# Patient Record
Sex: Female | Born: 1980 | Race: White | Hispanic: No | Marital: Single | Smoking: Current every day smoker
Health system: Southern US, Community
[De-identification: ages and names within clinical notes are randomized; demographics above are authoritative.]

## PROBLEM LIST (undated history)

## (undated) DIAGNOSIS — J40 Bronchitis, not specified as acute or chronic: Secondary | ICD-10-CM

## (undated) DIAGNOSIS — J45909 Unspecified asthma, uncomplicated: Secondary | ICD-10-CM

## (undated) DIAGNOSIS — Z836 Family history of other diseases of the respiratory system: Secondary | ICD-10-CM

## (undated) HISTORY — PX: TUBAL LIGATION: SHX77

## (undated) HISTORY — PX: HAND SURGERY: SHX662

---
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
## 2002-03-19 ENCOUNTER — Emergency Department (HOSPITAL_COMMUNITY): Admission: EM | Admit: 2002-03-19 | Discharge: 2002-03-19 | Payer: Self-pay | Admitting: Emergency Medicine

## 2002-04-23 ENCOUNTER — Ambulatory Visit (HOSPITAL_COMMUNITY): Admission: RE | Admit: 2002-04-23 | Discharge: 2002-04-23 | Payer: Self-pay | Admitting: *Deleted

## 2003-06-26 ENCOUNTER — Ambulatory Visit (HOSPITAL_COMMUNITY): Admission: RE | Admit: 2003-06-26 | Discharge: 2003-06-26 | Payer: Self-pay | Admitting: *Deleted

## 2003-06-26 ENCOUNTER — Encounter: Payer: Self-pay | Admitting: *Deleted

## 2003-08-01 ENCOUNTER — Encounter: Payer: Self-pay | Admitting: Emergency Medicine

## 2003-08-02 ENCOUNTER — Ambulatory Visit (HOSPITAL_COMMUNITY): Admission: EM | Admit: 2003-08-02 | Discharge: 2003-08-02 | Payer: Self-pay | Admitting: Emergency Medicine

## 2003-08-13 ENCOUNTER — Ambulatory Visit (HOSPITAL_COMMUNITY): Admission: RE | Admit: 2003-08-13 | Discharge: 2003-08-13 | Payer: Self-pay | Admitting: *Deleted

## 2003-10-30 ENCOUNTER — Inpatient Hospital Stay (HOSPITAL_COMMUNITY): Admission: AD | Admit: 2003-10-30 | Discharge: 2003-10-31 | Payer: Self-pay | Admitting: *Deleted

## 2005-03-06 ENCOUNTER — Emergency Department (HOSPITAL_COMMUNITY): Admission: EM | Admit: 2005-03-06 | Discharge: 2005-03-06 | Payer: Self-pay | Admitting: Emergency Medicine

## 2005-08-23 ENCOUNTER — Ambulatory Visit (HOSPITAL_COMMUNITY): Admission: RE | Admit: 2005-08-23 | Discharge: 2005-08-23 | Payer: Self-pay | Admitting: *Deleted

## 2005-12-27 ENCOUNTER — Ambulatory Visit (HOSPITAL_COMMUNITY): Admission: AD | Admit: 2005-12-27 | Discharge: 2005-12-27 | Payer: Self-pay | Admitting: *Deleted

## 2006-01-05 ENCOUNTER — Inpatient Hospital Stay (HOSPITAL_COMMUNITY): Admission: AD | Admit: 2006-01-05 | Discharge: 2006-01-06 | Payer: Self-pay | Admitting: *Deleted

## 2006-01-29 ENCOUNTER — Emergency Department (HOSPITAL_COMMUNITY): Admission: EM | Admit: 2006-01-29 | Discharge: 2006-01-29 | Payer: Self-pay | Admitting: Emergency Medicine

## 2006-03-20 ENCOUNTER — Emergency Department (HOSPITAL_COMMUNITY): Admission: EM | Admit: 2006-03-20 | Discharge: 2006-03-20 | Payer: Self-pay | Admitting: Emergency Medicine

## 2006-07-17 ENCOUNTER — Ambulatory Visit (HOSPITAL_COMMUNITY): Admission: RE | Admit: 2006-07-17 | Discharge: 2006-07-17 | Payer: Self-pay | Admitting: Obstetrics and Gynecology

## 2007-08-26 ENCOUNTER — Emergency Department (HOSPITAL_COMMUNITY): Admission: EM | Admit: 2007-08-26 | Discharge: 2007-08-26 | Payer: Self-pay | Admitting: Emergency Medicine

## 2007-10-18 HISTORY — PX: SHOULDER SURGERY: SHX246

## 2007-12-27 ENCOUNTER — Emergency Department (HOSPITAL_COMMUNITY): Admission: EM | Admit: 2007-12-27 | Discharge: 2007-12-27 | Payer: Self-pay | Admitting: Emergency Medicine

## 2008-08-01 ENCOUNTER — Emergency Department (HOSPITAL_COMMUNITY): Admission: EM | Admit: 2008-08-01 | Discharge: 2008-08-01 | Payer: Self-pay | Admitting: Emergency Medicine

## 2008-08-31 ENCOUNTER — Inpatient Hospital Stay (HOSPITAL_COMMUNITY): Admission: EM | Admit: 2008-08-31 | Discharge: 2008-09-05 | Payer: Self-pay

## 2009-01-26 ENCOUNTER — Emergency Department (HOSPITAL_COMMUNITY): Admission: EM | Admit: 2009-01-26 | Discharge: 2009-01-27 | Payer: Self-pay | Admitting: Emergency Medicine
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
## 2009-04-08 IMAGING — RF DG HUMERUS 2V *R*
1 series · 2 of 2 positions shown · non-contrast
Comparison: 08/30/2008

CLINICAL DATA: Right humerus open reduction internal fixation

RIGHT HUMERUS - 2+ VIEW

[Series 1: run · 2 of 2 slices shown]
[im 1/2]
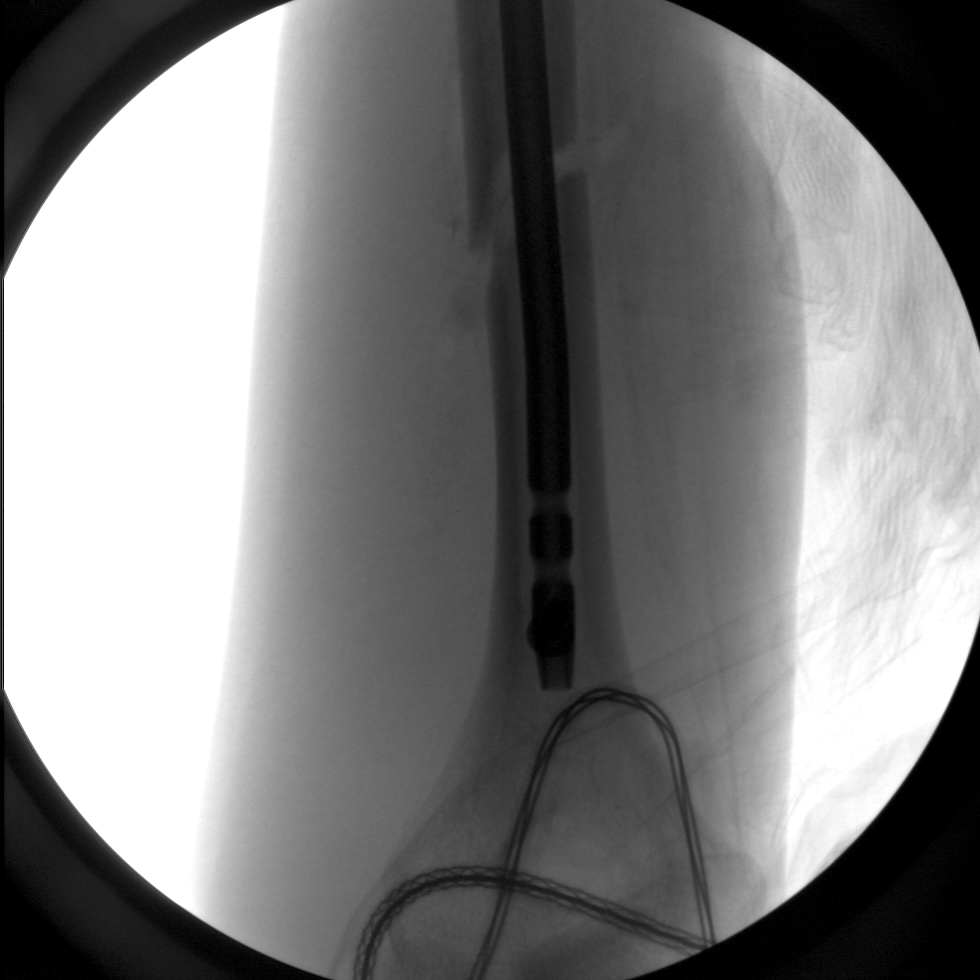
[im 2/2]
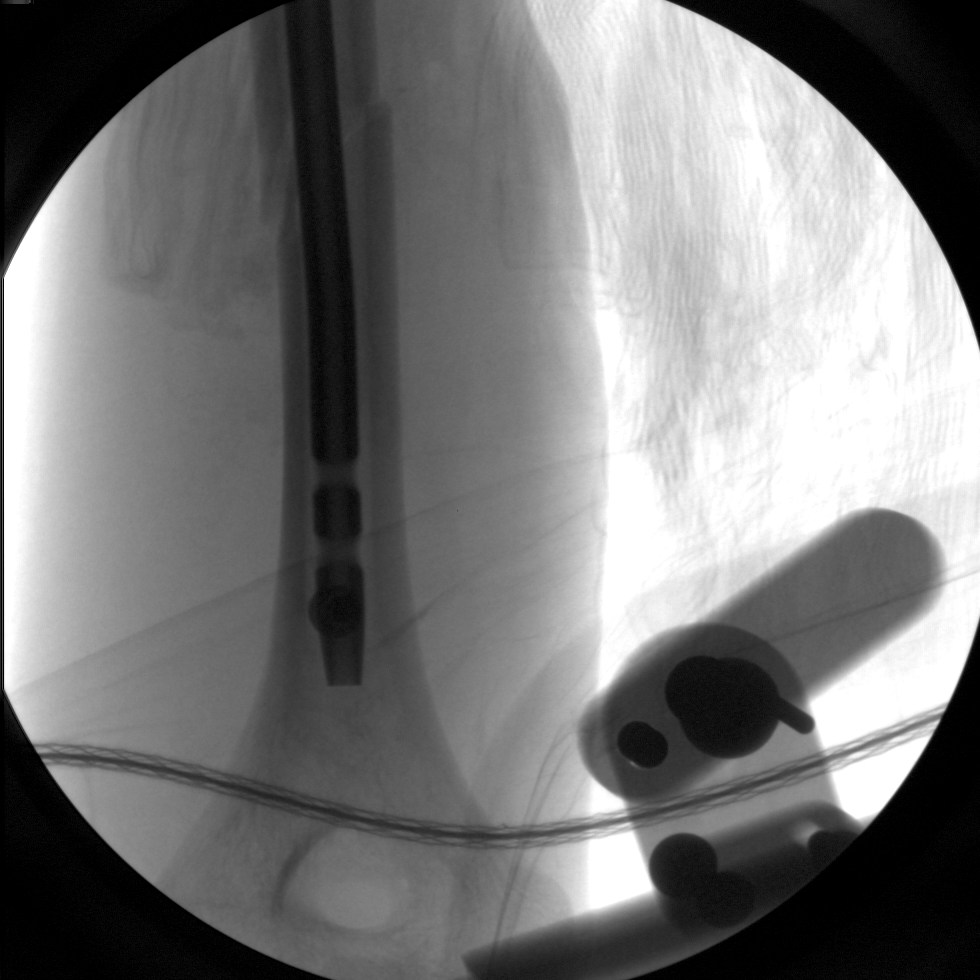

[2 of 2 positions shown; findings below may reference images not displayed]

FINDINGS: Total fluoroscopic time 52 seconds

Two intraoperative fluoroscopic images demonstrate a portion of an
intramedullary rod fixing the previously seen mid shaft right
humeral fracture.  Fracture fragments are in near anatomic
alignment.  No visualized hardware failure but the hardware is
incompletely visualized.
IMPRESSION: Intraoperative fluoroscopic images as above.
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
## 2009-06-24 ENCOUNTER — Ambulatory Visit (HOSPITAL_BASED_OUTPATIENT_CLINIC_OR_DEPARTMENT_OTHER): Admission: RE | Admit: 2009-06-24 | Discharge: 2009-06-24 | Payer: Self-pay | Admitting: Orthopedic Surgery

## 2010-11-07 ENCOUNTER — Encounter: Payer: Self-pay | Admitting: Emergency Medicine

## 2011-01-21 LAB — POCT HEMOGLOBIN-HEMACUE: Hemoglobin: 10.2 g/dL — ABNORMAL LOW (ref 12.0–15.0)
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
## 2011-03-04 NOTE — H&P (Signed)
[name], SPOERL             ACCOUNT NO.:  [id number]   MEDICAL RECORD NO.:  [id number]          PATIENT TYPE:  AMB   LOCATION:  DAY                           FACILITY:  [facility]   PHYSICIAN:  Tilda Burrow, M.D. DATE OF BIRTH:  04-May-1981   DATE OF ADMISSION:  07/17/2006  DATE OF DISCHARGE:  LH                                HISTORY & PHYSICAL   ADMISSION DIAGNOSIS:  Elective sterilization.   HISTORY OF PRESENT ILLNESS:  This 30 year old female, LMP July 03, 2006, on Lo-Estrin 24 oral contraception with good compliance is admitted  for elective permanent sterilization.  She has been seen in our office,  confirmed desire for permanent sterilization, signed appropriate Medicaid  sterilization forms, and is scheduled for July 17, 2006.  The failure of 1  in 100 quoted to the patient with Falope ring technique reviewed with the  patient.  She had originally wanted a tubal cautery but understands the  rationale for Falope ring.   PAST MEDICAL HISTORY:  Benign.   PAST SURGICAL HISTORY:  1. Dilatation and curettage in 2003 for pregnancy.  2. Teeth removed in 2006 by Dr. Barbette Merino of [place].  3. Hand surgery in [place] in 1995.   ALLERGIES:  PENICILLIN.   MEDICATIONS:  Birth control pills.   PHYSICAL EXAMINATION:  VITAL SIGNS:  Height 5 feet, 7.  Weight 110 pounds.  Blood pressure 106/60, pulse in 70s.  GENERAL:  A healthy, slim, Caucasian female, alert and oriented x3.  HEENT:  Pupils equal, round, reactive.  Missing 2 teeth upper gum as taken  out by Dr. Barbette Merino.  NECK:  Supple.  Thyroid normal.  CHEST:  Clear to auscultation.  BREASTS:  Exam deferred.  LUNGS:  Clear.  ABDOMEN:  Nontender, slim, without masses.  PELVIC:  External genital, cervix multiparous, uterus anteflexed.  GC and  Chlamydia cultures negative.  Adnexa nontender without masses.   PLAN:  Laparoscopic tubal sterilization, Falope rings, July 17, 2006.      Tilda Burrow, M.D.  Electronically Signed     JVF/MEDQ  D:  07/14/2006  T:  07/14/2006  Job:  [id number]

## 2011-03-04 NOTE — Op Note (Signed)
[name], [name]                         ACCOUNT NO.:  [id number]   MEDICAL RECORD NO.:  [id number]                   PATIENT TYPE:  INP   LOCATION:  LDR1                                 FACILITY:  [facility]   PHYSICIAN:  Langley Gauss, M.D.                DATE OF BIRTH:  10/26/1980   DATE OF PROCEDURE:  DATE OF DISCHARGE:                                 OPERATIVE REPORT   PROCEDURE:  Placement of continuous lumbar epidural analgesia at the L3-L4  interspace performed by Dr. Roylene Reason. Lisette Grinder.   COMPLICATIONS:  None.   SUMMARY:  An appropriate and informed consent was obtained.  The patient had  had an epidural placed during the previous course of labor.  The patient is  placed in a seated position at which time bony landmarks were identified.  The L2-L3 interspace was chosen.  The patient's back is sterilely prepped  and draped utilizing the epidural try. At the midline of the L2-L3  interspace 3 cc of 1% lidocaine plain is placed to raise a small skin wheal.   The 17-gauge Touhy-Schliff needle is then utilized with loss of resistance  in air-filled glass syringe to atraumatically entry into the epidural space  on the first attempt. It was very apparent that the first attempt was  successful with no CSF or blood encountered and excellent loss of resistance  consistent with entry into the epidural space.  Thus, to expedite with  setting up of the block, 5 cc of 2% lidocaine plain injected as test doses.  The catheter was then inserted to a depth of 4 cm.  The epidural needle is  removed.  The patient is having tingling in the feet consisting with a  proper setting up epidural block. The patient is connected to the infusion  pump with the standard mixture which is secured in place.  She is treated  with a bolus of 10 cc followed by continuous infusion at a rate of 14 cc per  hour.  Upon return to the lateral supine position the patient is having  tingling in the feet and heaviness  in the legs consisting with properly  functioning epidural.      ___________________________________________                                            Langley Gauss, M.D.   DC/MEDQ  D:  10/30/2003  T:  10/30/2003  Job:  [id number]

## 2011-03-04 NOTE — Op Note (Signed)
NAMEJALYNNE, [name]                         ACCOUNT NO.:  [id number]   MEDICAL RECORD NO.:  [id number]                  PATIENT TYPE:   LOCATION:                                       FACILITY:   PHYSICIAN:  Langley Gauss, M.D.                DATE OF BIRTH:   DATE OF PROCEDURE:  10/30/2003  DATE OF DISCHARGE:                                 OPERATIVE REPORT   DELIVERY NOTE   DIAGNOSES:  1. A 39-week intrauterine pregnancy for induction of labor.  2. Placement of continuous lumbar epidural analgesia.  3. Spontaneous assisted vaginal delivery of a 6 pound 14 ounce female infant     delivered over an intact perineum; delivery performed by Dr. Roylene Reason.     Lisette Grinder.   ESTIMATED BLOOD LOSS:  Less than 500 cc   COMPLICATIONS:  None.   SPECIMENS:  1. Arterial cord gas and cord blood to pathology laboratory.  2. Placenta is examined and noted to be apparently intake with a 3-vessel     umbilical cord.   ANALGESIA FOR DELIVERY:  Patient received epidural during the course of  labor.   SUMMARY:  The patient was admitted for induction of labor.  Initial  examination 4 cm dilated, 80% effaced and 0 station vertex presentation. She  was noted to be having fairly regular uterine contractions on the monitor.  Amniotomy is performed with findings of clear amniotic fluid.  Fetal scalp  electrode is placed which documented a reassuring fetal heart rate.  The  patient, thereafter, was noted to spontaneously enter labor.  She did not  require any Pitocin augmentation or induction.   With onset of active labor the patient initially was treated with 10 mg of  IV Nubain, subsequently she requested epidural which was placed without  difficulty. This functioned very well.  During the remainder of the course  of labor, pertinently the patient had progressed rapidly to 9 cm dilatation.   With continued uterine contractions and excellent epidural block the patient  was noted to progress to  complete dilatation.  She had descent of the vertex  to a +2 station without any expulsive efforts. She is, thus, placed in the  dorsal lithotomy position and prepped and draped in the usual sterile  manner. The patient pushed very well during this short second stage of labor  to deliver in a direct OA position over an intact perineum.   The mouth and nares were bulb suctioned of clear amniotic fluid.  Spontaneous rotation converted ot a left anterior shoulder position. Gentle  downward traction combined with expulsive efforts resulted in delivery of  this anterior shoulder beneath the pubic symphysis without difficulty.  The  remainder of the infant also delivered without difficulty. The umbilical  cord is milked towards the infant; the he cord is doubly clamped and cut.  The infant is taken to the nursery table for immediate assessment.  Arterial  cord gases and cord blood are then obtained.  Gentle traction on the  umbilical cord results in separation, which upon examination appears to be  an intact placenta with a 3-vessel cord.  Excellent uterine tone is achieved  following delivery.   Examination of the genital tract reveals no lacerations.  The patient is  thus taken out of dorsal lithotomy position and rolled to her side, at which  time the epidural catheter is removed with the blue tip noted to be intact.      ___________________________________________                                            Langley Gauss, M.D.   DC/MEDQ  D:  10/30/2003  T:  10/30/2003  Job:  [id number]

## 2011-03-04 NOTE — Discharge Summary (Signed)
NAMEKINDRA, BICKHAM                         ACCOUNT NO.:  [id number]   MEDICAL RECORD NO.:  [id number]                   PATIENT TYPE:  INP   LOCATION:  A411                                 FACILITY:  [facility]   PHYSICIAN:  Langley Gauss, M.D.                DATE OF BIRTH:  Feb 10, 1981   DATE OF ADMISSION:  10/30/2003  DATE OF DISCHARGE:  10/31/2003                                 DISCHARGE SUMMARY   DIAGNOSES:  1. Term intrauterine pregnancy.  2. Planned induction of labor, however patient presented in early stages of     labor and after amniotomy spontaneously entered active and adequate labor     pattern.   DELIVERY PERFORMED:  Spontaneous assisted vaginal delivery of a 6 pound 14  ounce female infant delivered over an intact perineum.   OTHER PROCEDURES:  Placement of continuous lumbar epidural analgesia on  10/30/2003.   DISPOSITION:  At the time of discharge the patient is bottle feeding.  She  is interested in initiating oral contraceptive.  She will follow-up in the  office in four weeks time, at which time this can be further discussed.  Patient is given a copy of standard discharge instructions.   PERTINENT LABORATORY STUDIES:  RPR is nonreactive.  Remainder of laboratory  studies within normal limits.   HOSPITAL COURSE:  Patient admitted on October 30, 2003, for planned  induction of labor.  Patient was noted to be having fairly regular uterine  contractions on external fetal monitor.  Amniotomy was performed with the  findings as above.  Clear amniotic fluid.  Fetal scalp __________ documented  a reassuring fetal heart rate throughout the course of labor.  After  amniotomy patient spontaneously entered adequate and active labor pattern.  She received one dose of IV Nubain during the course of labor, thereafter  requested epidural analgesic.  The epidural was placed without complications  and functioned very well throughout the remainder of labor.  The course of  delivery itself was uncomplicated and performed early p.m. of October 30, 2003.  Postpartum the patient did well, she bonded well with the infant, had  no postpartum complications, thus was discharged home on October 31, 2003  with the infant being 47 hours old.     ___________________________________________                                         Langley Gauss, M.D.   DC/MEDQ  D:  11/01/2003  T:  11/01/2003  Job:  [id number]

## 2011-03-04 NOTE — Discharge Summary (Signed)
[facility]  Patient:    MISKI, FELDPAUSCH Visit Number: [id number] MRN: [id number]          Service Type: DSU Location: DAY Attending Physician:  Jeri Cos. Dictated by:   Langley Gauss, M.D. Admit Date:  04/23/2002 Discharge Date: 04/23/2002                             Discharge Summary  PROCEDURE:  Dilatation and curettage utilizing a #8 suction catheter tip.  SURGEON:  Roylene Reason. Lisette Grinder,  M.D.  DISPOSITION:  Patient is to follow up in the office in one weeks time.  She is given a copy of standardized discharge instructions.  DISCHARGE MEDICATIONS:  Vicuprofen for pain relief, #30 with no refill.  PERTINENT LABORATORY STUDIES:  Patient is noted to be beta HCG negative.  HOSPITAL COURSE:  See previous dictations.  Patient had dilatation and curettage utilizing a #8 suction catheter tip performed without difficulty. Postoperatively, the patient did well.  She satisfied all criteria for her discharge and, prior to discharge, was herself discharging home on date of service, April 23, 2002. Dictated by:   Langley Gauss, M.D. Attending Physician:  Jeri Cos. DD:  04/26/02 TD:  04/30/02 Job: [id number]

## 2011-03-04 NOTE — Op Note (Signed)
NAMEROYCE, SCIARA             ACCOUNT NO.:  [id number]   MEDICAL RECORD NO.:  [id number]          PATIENT TYPE:  AMB   LOCATION:  DAY                           FACILITY:  [facility]   PHYSICIAN:  Tilda Burrow, M.D. DATE OF BIRTH:  [birth year]   DATE OF PROCEDURE:  07/17/2006  DATE OF DISCHARGE:                                 OPERATIVE REPORT   PREOPERATIVE DIAGNOSIS:  Elective sterilization.   POSTOPERATIVE DIAGNOSIS:  Elective sterilization.   OPERATION/PROCEDURE:  Laparoscopic tubal sterilization with Falope rings.   SURGEON:  Tilda Burrow, M.D.   ASSISTANT:  None.   ANESTHESIA:  General.   COMPLICATIONS:  None.   FINDINGS:  Extremely thin body habitus making the distance from the  posterior retroperitoneal structures to the anterior abdominal wall very  minimal.  No __________  surgical complications encountered or suspected.   DETAILS OF PROCEDURE:  The patient was taken to the operating room, prepped  and draped in the usual standard fashion with legs in low lithotomy leg  supports after general anesthesia was introduced without difficulty.  The  bladder was in-and-out catheterized and Hulka tenaculum attached to the  cervix for uterine  manipulation.  An infraumbilical, vertical, 1-cm, skin  incision was made as well as a transverse suprapubic 1-cm incision. A Veress  needle was used to achieve pneumoperitoneum through the umbilical incision  while being careful to orient the needle toward the pelvis while elevating  the abdominal wall by manual elevation. Water droplet test was used to  confirm intraperitoneal placement.   Pneumoperitoneum was achieved easily under 8-to-10 mm of intra-abdominal  pressure; and the laparoscopic trocar was introduced, a 5-mm blunt tipped  trocar, under direct visualization using the video camera.  Peritoneal  cavity was entered without difficulty.  Inspection of the anterior surfaces  of the abdominal contents showed no evidence  of injury or bleeding.  Attention was directed to the pelvis.  Findings were as described above.   Attention was first directed to the left fallopian tube which was elevated,  identified to its fimbriated end and grasped in its midportion with Falope  ring applier.  Falope ring applied and then the tube infiltrated with  Marcaine solution 0.25% using a 22-gauge spinal needle percutaneously  applied.   Attention was then directed to the right fallopian tube where a similar  procedure was performed.  Photo documentation of the ring placements was  performed; 120 cc of saline was instilled into the abdomen; deflation of  CO2 performed; instruments removed and subcuticular 4-0 Dexon closure of  skin incisions performed.  The rest of the surgical instruments were  removed; Steri-Strips placed.  The patient allowed to awaken and go to  recovery room in standard fashion.      Tilda Burrow, M.D.  Electronically Signed     JVF/MEDQ  D:  07/17/2006  T:  07/18/2006  Job:  [id number]

## 2011-03-04 NOTE — Op Note (Signed)
[facility]  Patient:    [name], [name] Visit Number: [id number] MRN: [id number]          Service Type: DSU Location: DAY Attending Physician:  Jeri Cos. Dictated by:   Langley Gauss, M.D. Proc. Date: 04/23/02 Admit Date:  04/23/2002 Discharge Date: 04/23/2002                             Operative Report  PROCEDURE PERFORMED:  Dilatation and curettage utilizing a #8 suction catheter tip.  DIAGNOSIS:  Missed abortion at [redacted] weeks gestation.  PROCEDURE:  Dilation and curettage utilizing a #8 suction catheter tip.  SURGEON:  Roylene Reason. Lisette Grinder, M.D.  ANALGESIA:  General anesthesia with oral airway placed.  SPECIMENS:  Obvious products of conception obtained at time of suction curettage sent off for a permanent specimen only.  DESCRIPTION OF OPERATION:  Patient was seen immediately in the preoperative area at which time we confirmed the procedure.  Patient was taken to the operating room where vital signs were stable.  Patient was placed in the dorsal lithotomy position after induction of general anesthesia.  She was prepped and draped in the usual sterile manner.  Speculum was placed in the vaginal vault which reveals moderate active bleeding.  Anterior lip of the cervix was grasped with a single-toothed tenaculum.  The uterus is noted to be dilated to 1 cm.  The uterus is then sounded to a depth of 8 cm.  Progressive dilatation is then performed up to a size #17 dilator which allows passage of a #8 suction catheter tip through the endocervix and into the uterine cavity itself.  Gentle traction is applied with a slow circular withdrawing motion. The entire uterine cavity was suction curettage of obvious products of conception obtained.  Fine ______ curettage was then performed to include at the uterine fundus to confirm the absence of any residual tissue.  No complications are noted to have occurred.  Patient is then administered 0.2  mg of IM methergine.  She is taken out of dorsal lithotomy position and taken to the recovery room in stable condition at which time operative findings were discussed with the patients awaiting family. Dictated by:   Langley Gauss, M.D. Attending Physician:  Jeri Cos. DD:  04/26/02 TD:  04/30/02 Job: [id number]

## 2011-03-04 NOTE — H&P (Signed)
NAMEGAYL, [name]                         ACCOUNT NO.:  [id number]   MEDICAL RECORD NO.:  [id number]                   PATIENT TYPE:  INP   LOCATION:  A411                                 FACILITY:  [facility]   PHYSICIAN:  Langley Gauss, M.D.                DATE OF BIRTH:  1980/12/30   DATE OF ADMISSION:  10/30/2003  DATE OF DISCHARGE:                                HISTORY & PHYSICAL   HISTORY OF PRESENT ILLNESS:  This is a 30 year old, gravida 3, para 1, at [redacted]  weeks gestation who is admitted for induction of labor secondary to  psychosocial factors.  In addition, the patient is noted to have a prior  history of rapid labor and delivery with a short second stage of labor.  The  patient's prenatal course has been uncomplicated.  She did have a normal  glucose tolerance test at 62.  The patient did, however, present too late to  have an AFP triple screen performed.  She has had serial ultrasounds which  have documented adequate fetal growth.  She has had a 24 pound weight gain  during the pregnancy.  The patient smoked one pack per day at the onset of  pregnancy, states she has been trying to quit during the pregnancy.  The  patient is known to be GBS negative carrier status, October 14, 2003.  She  did receive the influenza vaccine on September 09, 2003.   ALLERGIES:  She states she is allergic to PENICILLIN which gives her nausea.  She is able to tolerate cephalosporins, however.   CURRENT MEDICATIONS:  1. Prenatal vitamins.  2. The patient has been taking Tylox and Lortab during the pregnancy for     chronic back pain exacerbated during the pregnancy.   SOCIAL HISTORY:  The patient is unemployed.  The father of the baby is named  Elisabeth Pigeon.  He is also the father of her first baby.  On May 03, 2001,  the patient had a vaginal delivery of a 6 pound 6 ounce infant.  The patient  received epidural during that labor and delivery, would like epidural also  during this course of  labor and delivery.  She had a D&C on April 02, 2002,  for first trimester miscarriage.   PHYSICAL EXAMINATION:  GENERAL:  In no acute distress.  HEENT:  Poor dentition.  Negative.  No adenopathy.  VITAL SIGNS:  Height is 5 feet 7 inches, pre-pregnancy weight 108, most  recent weight 132, blood pressure is 113/71, pulse rate of 80, respiratory  rate is 20.  NECK:  Supple, thyroid is not palpable.  LUNGS:  Clear.  CARDIOVASCULAR:  Regular rate and rhythm.  ABDOMEN:  Soft and nontender.  No surgical scars are identified.  The  patient is vertex presentation by Leopold's maneuvers.  EXTREMITIES:  Normal.  PELVIC:  Normal external genitalia, no lesions or ulcerations identified.  Fundal height is measured  at  34 cm, vertex presentation by Leopold's maneuvers.  No vaginal bleeding.  No  leakage of fluids.  Cervix noted to be 4 cm dilated, intact membranes, 6%  effaced, with the vertex at a 0 station.   ASSESSMENT:  A 39-week intrauterine pregnancy, favorable cervix for  induction.  The patient has a prior history of rapid labor and delivery with  short second stage of labor.  She is admitted for induction of labor at this  time.  We will proceed with amniotomy.  Therefore, augment or induce labor  as clinically indicated.     ___________________________________________                                         Langley Gauss, M.D.   DC/MEDQ  D:  10/30/2003  T:  10/30/2003  Job:  [id number]

## 2011-03-04 NOTE — Op Note (Signed)
[name], [name]             ACCOUNT NO.:  [id number]   MEDICAL RECORD NO.:  [id number]          PATIENT TYPE:  INP   LOCATION:  A402                          FACILITY:  [facility]   PHYSICIAN:  Langley Gauss, MD     DATE OF BIRTH:  11/20/1980   DATE OF PROCEDURE:  01/05/2006  DATE OF DISCHARGE:                                 OPERATIVE REPORT   DIAGNOSES:  A 38- to 39-week intrauterine pregnancy for induction of labor.   PROCEDURE:  Well controlled spontaneous assisted vaginal delivery of 6 pound  10 ounce female infant delivered over an intact perineum. Delivery performed  by Dr. Langley Gauss.   ESTIMATED BLOOD LOSS:  Less than 500 cc.   COMPLICATIONS:  None.   SPECIMENS:  Arterial cord gas and cord blood to laboratory. Placenta is  examined and noted to be apparently intact with a three-vessel umbilical  cord.   Total estimated blood loss less than 500 cc.   ANALGESIA:  The patient received IV Nubain and Phenergan during the course  of labor. She subsequently requested epidural but progressed so rapidly  through the transitional phase of labor that it was impossible to place an  epidural.   SUMMARY:  Patient admitted for induction of labor on today's date of  service. She has two other young children at home with a history of rapid  labor delivery. External fetal monitor reveals a reassuring fetal heart,  intact membranes, no uterine contraction identified. Cervix 2 cm dilated, -1  station, 60% effaced. After emptying her bladder, the vertex was well  applied to the cervix. Fetal scalp electrode was subsequently placed with  resultant amniotomy and findings of clear amniotic fluid. Fetal scalp  electrode documented a reassuring fetal heart rate. The patient was managed  expectantly after which time she did require Pitocin augmentation. At 5 cm  dilatation, she received IV Nubain and Phenergan which provided moderate  relief. Subsequently, I was contacted stating the  patient requested  epidural. However, less than 10 minutes later, I was contacted on an urgent  basis stating the patient was completely dilated with an urge to push. On  examination, she was noted to be completely dilated with the vertex at a +2  station. She was placed in a dorsolithotomy position, prepped and draped in  usual sterile manner. She pushed very well during short second stage of  labor. Excellent perineal support was provided which delivery then occurred  in a direct OA position over an intact perineum. Mouth and nares were bulb  suctioned of clear amniotic fluid. Spontaneous rotation occurred to a right  anterior shoulder position. Gentle downward traction combined with expulsive  efforts resulted in delivery of this anterior shoulder beneath the pubic  symphysis without difficulty. Umbilical cord was milked toward the infant.  Cord was doubly clamped and cut. Infant was taken to nursery table for  immediate assessment. Arterial cord gas and cord blood were then obtained.  Gentle traction on the umbilical cord resulted in separation which upon  examination is felt to be an intact placenta with associated three-vessel  umbilical cord. Examination  of the genital tract reveals genital tract to be  intact. No perineal or sidewall lacerations noted. Excellent uterine tone  was achieved following delivery with initiation of IV Pitocin solution. The  patient tolerated the  procedure very well, taken out dorsolithotomy position. The patient does  state that she does desire permanent sterilization and does state that this  has been discussed during the pregnancy possibility of performing this  during this hospital stay on postpartum day #1.      Langley Gauss, MD  Electronically Signed     DC/MEDQ  D:  01/05/2006  T:  01/06/2006  Job:  [id number]

## 2011-03-04 NOTE — H&P (Signed)
[facility]  Patient:    [name], [name] Visit Number: [id number] MRN: [id number]          Service Type: DSU Location: DAY Attending Physician:  Jeri Cos. Dictated by:   Langley Gauss, M.D. Admit Date:  04/23/2002 Discharge Date: 04/23/2002                           History and Physical  HISTORY OF PRESENT ILLNESS:  This is a 30 year old, gravida 2, para 1 at [redacted] weeks gestation by dates and by date of previous positive pregnancy test, who was seen in the office one day previously with diagnosis of missed abortion with vaginal bleeding.  The patient is now referred for outpatient surgical procedure on April 23, 2002 for dilatation and curettage.  The patients prenatal course has been uncomplicated until three days prior to this most recent visit, at which time, the patient began complaining of increased pelvic cramping as well as the onset of very light vaginal bleeding. On the date of evaluation, April 23, 2002, the patient had marked increase in vaginal bleeding and was evaluated in the office; at which time, she was determined to have a missed abortion.  ALLERGIES:  She states she is allergic to PENICILLIN which causes her throat to swell.  CURRENT MEDICATIONS:  Prenatal vitamins.  PAST OBSTETRICAL HISTORY:  One prior vaginal delivery without complications.  PHYSICAL EXAMINATION:  GENERAL:  Pleasant white female.  VITAL SIGNS:  Height is 5 foot 7.  Pulse of 108, blood pressure 102/57, pulse rate of 80, respiratory rate is 20.  HEENT:  Negative.  Mucus membranes moist.  NECK:  No adenopathy.  Neck is supple.  Thyroid is not palpable.  LUNGS:  Clear.  CARDIOVASCULAR:  Regular rate and rhythm.  ABDOMEN:  Soft and nontender.  No surgical scars are identified.  Uterus is slightly enlarged.  No adnexal masses are appreciated.  PELVIC:  Sterile speculum examination performed reveals moderate vaginal bleeding to be occurring.   Bimanual examination revealed only a slightly increased sized uterus with no adnexal masses appreciated.  LABORATORY DATA:  Transvaginal ultrasound performed in the office on April 22, 2002 revealed intrauterine pregnancy; however, crown-rump length is only six week size.  No fetal cardiac activity is identified.  No fetal movement identified. There was likewise found to be no adnexal masses.  There is moderate vaginal bleeding occurring at the time of the procedure.  ASSESSMENT:  The patient with intrauterine pregnancy which is 11 weeks by well documented dating criteria who now presents with vaginal bleeding crown-rump length consistent with six weeks gestation and no fetal cardiac activity. Thus, this supports the diagnosis of missed abortion.  On April 23, 2002, the patient is be to taken to the operating room where dilatation and curettage utilizing suction will be performed. Dictated by:   Langley Gauss, M.D. Attending Physician:  Jeri Cos. DD:  04/26/02 TD:  04/29/02 Job: [id number]

## 2011-03-04 NOTE — Discharge Summary (Signed)
[name], [name]             ACCOUNT NO.:  [id number]   MEDICAL RECORD NO.:  [id number]          PATIENT TYPE:  INP   LOCATION:  A402                          FACILITY:  [facility]   PHYSICIAN:  Langley Gauss, MD     DATE OF BIRTH:  1981-06-12   DATE OF ADMISSION:  01/05/2006  DATE OF DISCHARGE:  03/23/2007LH                                 DISCHARGE SUMMARY   DATE OF DELIVERY:  January 05, 2006.   DISCHARGE MEDICATIONS:  Tylox 20 without refill.   DISPOSITION:  Given a copy of standard discharge instructions.  Follow up in  4-6 weeks' time for postpartum visit.   PERTINENT LABORATORY STUDIES:  Admission hemoglobin and hematocrit 8.9/27;  postpartum day 1, 8.4/25.6.  Additional diagnosis thus includes anemia  secondary to iron deficiency.   HOSPITAL COURSE:  See previous dictations.  Induction initiated and delivery  accomplished on January 05, 2006 utilizing IV narcotics only. The patient is  not be GBS carrier status negative.  She had an uncomplicated postpartum  course, and thus discharged home on January 06, 2006. Infant circumcision was  also performed on the day of discharge.      Langley Gauss, MD  Electronically Signed     DC/MEDQ  D:  02/12/2006  T:  02/13/2006  Job:  [id number]

## 2011-03-04 NOTE — H&P (Signed)
[name], [name]             ACCOUNT NO.:  [id number]   MEDICAL RECORD NO.:  [id number]          PATIENT TYPE:  INP   LOCATION:  LDR1                          FACILITY:  [facility]   PHYSICIAN:  Langley Gauss, MD     DATE OF BIRTH:  [birth year]   DATE OF ADMISSION:  DATE OF DISCHARGE:  LH                                HISTORY & PHYSICAL   PLANNED DATE OF ADMISSION:  January 05, 2006.   PLANNED PROCEDURE:  Induction of labor utilizing amniotomy and Pitocin as  clinically indicated, indication being psychosocial factors with three other  living children to provide child-care arrangements for.   The patient herself is a 30 year old gravida 3, para 2 at 77 to 72 weeks'  gestation. She has been followed prenatally. She was noted to be GBS carrier  status negative. She has had serial ultrasounds which have documented  adequate fetal growth and a normal anatomic survey. This is noted to be a  female fetus. AP positive blood type. HIV nonreactive. Hepatitis B negative.  Rubella immune. AFP triple screen within normal limits. Pap smear Class 1  July 18, 2005.   Prenatal course and past medical history are complicated by lumbar back pain  which is noted to be increased during pregnancy. The patient has been  maintained on a fairly regular intake of Lortab for this lumbar back pain.  She, however, has not exhibited any increasing dosage requirements  prenatally. Pertinently, she was at [facility] two days previously,  having uterine contractions at home, but on evaluation, she was noted to  have bilateral groin pain and dilated to 1 cm. She has had no subsequent  significant uterine contractions since then. No vaginal bleeding. No leakage  of fluid. She does describe good fetal movement.   PAST MEDICAL HISTORY:  She states she is allergic to PENICILLIN which gives  her throat swelling.   OBSTETRICAL HISTORY:  She has two prior vaginal deliveries without  complications. Deliveries  occurred:  May 03, 2001, vaginal delivery of a 7-  pound 14-ounce infant over at [facility]; October 30, 2003 vaginal delivery  of 7-pound 9-ounce at [facility]; both vaginal deliveries. She complained of  back and hip pain during both of these pregnancies. She has previously had  epidural with the previous labor and delivery with no adverse or untoward  effects noted.   Remainder of past medical history is negative.   PHYSICAL EXAMINATION:  VITAL SIGNS:  Prepregnancy weight is 112, height is 5  foot 6; most recent 137. Blood pressure 96/63, pulse rate 80, respiratory  rate 20.  HEENT:  Negative  NECK:  No adenopathy. Neck is supple. Thyroid is not palpable.  LUNGS:  Clear.  CARDIOVASCULAR:  Reveals regular rate and rhythm.  ABDOMEN:  Soft and nontender. She has vertex presentation by Leopold's  maneuvers.  PELVIC:  Clinically adequate. Cervix 2 cm dilated, -2 station, 60% effaced  with the vertex well applied. GBS-carrier status negative.   ASSESSMENT:  Gravida 3, para 2, two prior vaginal deliveries without  complications. Cervix is noted to be favorable for induction  and amniotomy.  Plan on referring patient, date of service January 05, 2006, for indicated  induction of labor.      Langley Gauss, MD  Electronically Signed     DC/MEDQ  D:  12/28/2005  T:  12/28/2005  Job:  [id number]

## 2011-07-19 LAB — CBC
HCT: 24.6 — ABNORMAL LOW
HCT: 32.9 — ABNORMAL LOW
Hemoglobin: 8.4 — ABNORMAL LOW
MCHC: 33.8
MCHC: 34.3
MCV: 94.4
RBC: 2.61 — ABNORMAL LOW
RDW: 15.5
WBC: 12.9 — ABNORMAL HIGH
WBC: 21.6 — ABNORMAL HIGH

## 2011-07-19 LAB — DIFFERENTIAL
Basophils Absolute: 0
Basophils Relative: 0
Eosinophils Relative: 0
Eosinophils Relative: 0
Lymphocytes Relative: 6 — ABNORMAL LOW
Monocytes Absolute: 1
Monocytes Relative: 4
Monocytes Relative: 5
Neutro Abs: 19.4 — ABNORMAL HIGH
Neutrophils Relative %: 90 — ABNORMAL HIGH
Neutrophils Relative %: 92 — ABNORMAL HIGH

## 2011-07-19 LAB — PROTIME-INR: INR: 1.2

## 2011-07-19 LAB — URINALYSIS, ROUTINE W REFLEX MICROSCOPIC: pH: 6

## 2011-07-19 LAB — TYPE AND SCREEN: ABO/RH(D): A POS

## 2011-07-19 LAB — COMPREHENSIVE METABOLIC PANEL
Albumin: 4.2
Alkaline Phosphatase: 38 — ABNORMAL LOW
CO2: 22
Chloride: 106
Creatinine, Ser: 0.93
GFR calc Af Amer: >60
GFR calc non Af Amer: >60
Glucose, Bld: 243 — ABNORMAL HIGH
Total Bilirubin: 0.6

## 2011-07-19 LAB — PREGNANCY, URINE: Preg Test, Ur: NEGATIVE

## 2011-07-19 LAB — URINE MICROSCOPIC-ADD ON

## 2011-07-20 LAB — CBC
HCT: 25.6 — ABNORMAL LOW
HCT: 29.3 — ABNORMAL LOW
HCT: 31.6 — ABNORMAL LOW
Hemoglobin: 10.7 — ABNORMAL LOW
Hemoglobin: 9.5 — ABNORMAL LOW
MCHC: 33.3
MCHC: 33.8
MCHC: 34.4
MCHC: 35
Platelets: 118 — ABNORMAL LOW
Platelets: 121 — ABNORMAL LOW
RBC: 2.98 — ABNORMAL LOW
RBC: 3.11 — ABNORMAL LOW
RBC: 3.39 — ABNORMAL LOW
RDW: 14.9
RDW: 15.1
WBC: 10.4
WBC: 6.4

## 2011-07-20 LAB — RAPID URINE DRUG SCREEN, HOSP PERFORMED
Cocaine: NOT DETECTED
Opiates: POSITIVE — AB

## 2011-07-20 LAB — HEMOGLOBIN AND HEMATOCRIT, BLOOD
HCT: 29.9 — ABNORMAL LOW
HCT: 30.3 — ABNORMAL LOW
Hemoglobin: 10.1 — ABNORMAL LOW
Hemoglobin: 9.3 — ABNORMAL LOW

## 2011-07-20 LAB — COMPREHENSIVE METABOLIC PANEL
ALT: 43 — ABNORMAL HIGH
Alkaline Phosphatase: 30 — ABNORMAL LOW
BUN: 2 — ABNORMAL LOW
Chloride: 107
GFR calc Af Amer: >60
Glucose, Bld: 116 — ABNORMAL HIGH
Sodium: 138

## 2011-07-20 LAB — BASIC METABOLIC PANEL
BUN: 2 — ABNORMAL LOW
CO2: 23
CO2: 28
Calcium: 7.8 — ABNORMAL LOW
Calcium: 8.1 — ABNORMAL LOW
Creatinine, Ser: 0.71
GFR calc Af Amer: >60
GFR calc Af Amer: >60
GFR calc non Af Amer: >60
GFR calc non Af Amer: >60
GFR calc non Af Amer: >60
Glucose, Bld: 102 — ABNORMAL HIGH
Glucose, Bld: 153 — ABNORMAL HIGH
Potassium: 3.8
Potassium: 3.8
Sodium: 138
Sodium: 141

## 2011-07-20 LAB — DIFFERENTIAL
Basophils Absolute: 0
Eosinophils Absolute: 0
Lymphs Abs: 0.3 — ABNORMAL LOW
Neutro Abs: 9.7 — ABNORMAL HIGH

## 2018-01-08 DIAGNOSIS — H52223 Regular astigmatism, bilateral: Secondary | ICD-10-CM | POA: Diagnosis not present

## 2018-01-08 DIAGNOSIS — H5211 Myopia, right eye: Secondary | ICD-10-CM | POA: Diagnosis not present

## 2019-11-03 ENCOUNTER — Ambulatory Visit
Admission: EM | Admit: 2019-11-03 | Discharge: 2019-11-03 | Disposition: A | Discharge status: Home / Self Care | Payer: Medicaid Other | Attending: Emergency Medicine | Admitting: Emergency Medicine

## 2019-11-03 ENCOUNTER — Other Ambulatory Visit: Payer: Self-pay

## 2019-11-03 DIAGNOSIS — Z20822 Contact with and (suspected) exposure to covid-19: Secondary | ICD-10-CM | POA: Diagnosis not present

## 2019-11-03 HISTORY — DX: Bronchitis, not specified as acute or chronic: J40

## 2019-11-03 HISTORY — DX: Unspecified asthma, uncomplicated: J45.909

## 2019-11-03 HISTORY — DX: Family history of other diseases of the respiratory system: Z83.6

## 2019-11-03 NOTE — Discharge Instructions (Addendum)
COVID testing ordered.  It will take between 2-7 days for test results.  Someone will contact you regarding abnormal results.    In the meantime: You should remain isolated in your home for 10 days from symptom onset AND greater than 72 hours after symptoms resolution (absence of fever without the use of fever-reducing medication and improvement in respiratory symptoms), whichever is longer Get plenty of rest and push fluids Flonase prescribed for nasal congestion and runny nose Use medications daily for symptom relief Use OTC medications like ibuprofen or tylenol as needed fever or pain Call or go to the ED if you have any new or worsening symptoms such as fever, worsening cough, shortness of breath, chest tightness, chest pain, turning blue, changes in mental status, etc...  

## 2019-11-03 NOTE — ED Triage Notes (Signed)
Pt presents to UC w/ c/o fatigue, some nasal congestion since yesterday. Her employer wants her to be checked out before returning to work.

## 2019-11-03 NOTE — ED Provider Notes (Signed)
[facility]    CSN: [id number] Arrival date & time: 11/03/19  1441      History   Chief Complaint Chief Complaint  Patient presents with  . Fatigue    HPI [name] is a 39 y.o. female.   [place] 39 years old female presented to the urgent care for complaint of fatigue and nasal congestion for the past 2 to 3 days.  Denies sick exposure to COVID, flu or strep.  Denies recent travel.  Denies aggravating or alleviating symptoms.  Denies previous COVID infection.   Denies fever, chills, fatigue, nasal congestion, rhinorrhea, sore throat, cough, SOB, wheezing, chest pain, nausea, vomiting, changes in bowel or bladder habits.       Past Medical History:  Diagnosis Date  . Asthma   . Bronchitis   . Family history of punctured lung     There are no problems to display for this patient.   Past Surgical History:  Procedure Laterality Date  . HAND SURGERY    . SHOULDER SURGERY  2009    OB History   No obstetric history on file.      Home Medications    Prior to Admission medications   Not on File    Family History Family History  Problem Relation Age of Onset  . Healthy Mother   . Healthy Father     Social History Social History   Tobacco Use  . Smoking status: Current Every Day Smoker    Packs/day: 1.00    Types: Cigarettes  . Smokeless tobacco: Never Used  Substance Use Topics  . Alcohol use: Never  . Drug use: Not on file     Allergies   Penicillins   Review of Systems Review of Systems  Constitutional: Positive for fatigue.  HENT: Positive for congestion.   Respiratory: Negative.   Cardiovascular: Negative.   Gastrointestinal: Negative.   Neurological: Negative.      Physical Exam Triage Vital Signs ED Triage Vitals [11/03/19 1506]  Enc Vitals Group     BP      Pulse      Resp      Temp      Temp src      SpO2      Weight      Height      Head Circumference      Peak Flow      Pain Score 0   Pain Loc      Pain Edu?      Excl. in [place]?    No data found.  Updated Vital Signs LMP 10/18/2019   Visual Acuity Right Eye Distance:   Left Eye Distance:   Bilateral Distance:    Right Eye Near:   Left Eye Near:    Bilateral Near:     Physical Exam Vitals and nursing note reviewed.  Constitutional:      General: She is not in acute distress.    Appearance: Normal appearance. She is normal weight. She is not ill-appearing or toxic-appearing.  HENT:     Head: Normocephalic.     Right Ear: Tympanic membrane, ear canal and external ear normal. There is no impacted cerumen.     Left Ear: Tympanic membrane, ear canal and external ear normal. There is no impacted cerumen.     Nose: Nose normal. No congestion.     Mouth/Throat:     Mouth: Mucous membranes are moist.     Pharynx: Oropharynx is clear. No  oropharyngeal exudate or posterior oropharyngeal erythema.  Cardiovascular:     Rate and Rhythm: Normal rate and regular rhythm.     Pulses: Normal pulses.     Heart sounds: Normal heart sounds. No murmur.  Pulmonary:     Effort: Pulmonary effort is normal. No respiratory distress.     Breath sounds: Normal breath sounds. No wheezing or rhonchi.  Chest:     Chest wall: No tenderness.  Abdominal:     General: Abdomen is flat. Bowel sounds are normal. There is no distension.     Palpations: There is no mass.     Tenderness: There is no abdominal tenderness.  Skin:    Capillary Refill: Capillary refill takes less than 2 seconds.  Neurological:     General: No focal deficit present.     Mental Status: She is alert and oriented to person, place, and time.      UC Treatments / Results  Labs (all labs ordered are listed, but only abnormal results are displayed) Labs Reviewed  NOVEL CORONAVIRUS, NAA    EKG   Radiology No results found.  Procedures Procedures (including critical care time)  Medications Ordered in UC Medications - No data to display  Initial  Impression / Assessment and Plan / UC Course  I have reviewed the triage vital signs and the nursing notes.  Pertinent labs & imaging results that were available during my care of the patient were reviewed by me and considered in my medical decision making (see chart for details).    COVID-19 test was ordered Work note given Refused Flonase prescription Advised patient to quarantine To go to ED for worsening of symptoms Patient verbalized understanding of the plan of care  Final Clinical Impressions(s) / UC Diagnoses   Final diagnoses:  Suspected COVID-19 virus infection     Discharge Instructions     COVID testing ordered.  It will take between 2-7 days for test results.  Someone will contact you regarding abnormal results.    In the meantime: You should remain isolated in your home for 10 days from symptom onset AND greater than 72 hours after symptoms resolution (absence of fever without the use of fever-reducing medication and improvement in respiratory symptoms), whichever is longer Get plenty of rest and push fluids Flonase prescribed for nasal congestion and runny nose Use medications daily for symptom relief Use OTC medications like ibuprofen or tylenol as needed fever or pain Call or go to the ED if you have any new or worsening symptoms such as fever, worsening cough, shortness of breath, chest tightness, chest pain, turning blue, changes in mental status, etc...     ED Prescriptions    None     PDMP not reviewed this encounter.   Durward Parcel, FNP 11/03/19 1530

## 2019-11-04 LAB — NOVEL CORONAVIRUS, NAA: SARS-CoV-2, NAA: NOT DETECTED

## 2020-07-15 ENCOUNTER — Other Ambulatory Visit: Payer: Self-pay

## 2020-07-15 ENCOUNTER — Other Ambulatory Visit: Payer: Medicaid Other

## 2020-07-15 ENCOUNTER — Other Ambulatory Visit: Payer: Self-pay | Admitting: Critical Care Medicine

## 2020-07-15 DIAGNOSIS — Z20822 Contact with and (suspected) exposure to covid-19: Secondary | ICD-10-CM

## 2020-07-16 LAB — NOVEL CORONAVIRUS, NAA: SARS-CoV-2, NAA: NOT DETECTED

## 2020-07-16 LAB — SARS-COV-2, NAA 2 DAY TAT

## 2020-10-18 ENCOUNTER — Emergency Department (HOSPITAL_COMMUNITY): Payer: Medicaid Other

## 2020-10-18 ENCOUNTER — Emergency Department (HOSPITAL_COMMUNITY)
Admission: EM | Admit: 2020-10-18 | Discharge: 2020-10-18 | Disposition: A | Discharge status: Home / Self Care | Payer: Medicaid Other | Attending: Emergency Medicine | Admitting: Emergency Medicine

## 2020-10-18 ENCOUNTER — Other Ambulatory Visit: Payer: Self-pay

## 2020-10-18 DIAGNOSIS — Z79899 Other long term (current) drug therapy: Secondary | ICD-10-CM | POA: Insufficient documentation

## 2020-10-18 DIAGNOSIS — U071 COVID-19: Secondary | ICD-10-CM | POA: Insufficient documentation

## 2020-10-18 DIAGNOSIS — F17210 Nicotine dependence, cigarettes, uncomplicated: Secondary | ICD-10-CM | POA: Insufficient documentation

## 2020-10-18 DIAGNOSIS — R079 Chest pain, unspecified: Secondary | ICD-10-CM | POA: Insufficient documentation

## 2020-10-18 DIAGNOSIS — J45909 Unspecified asthma, uncomplicated: Secondary | ICD-10-CM | POA: Diagnosis not present

## 2020-10-18 LAB — CBC
HCT: 36 % (ref 36.0–46.0)
Hemoglobin: 11.7 g/dL — ABNORMAL LOW (ref 12.0–15.0)
MCH: 30.6 pg (ref 26.0–34.0)
MCHC: 32.5 g/dL (ref 30.0–36.0)
MCV: 94.2 fL (ref 80.0–100.0)
Platelets: 200 10*3/uL (ref 150–400)
RBC: 3.82 MIL/uL — ABNORMAL LOW (ref 3.87–5.11)
RDW: 15.7 % — ABNORMAL HIGH (ref 11.5–15.5)
WBC: 2.6 10*3/uL — ABNORMAL LOW (ref 4.0–10.5)
nRBC: 0 % (ref 0.0–0.2)

## 2020-10-18 LAB — BASIC METABOLIC PANEL
Anion gap: 7 (ref 5–15)
BUN: 9 mg/dL (ref 6–20)
CO2: 23 mmol/L (ref 22–32)
Calcium: 8.5 mg/dL — ABNORMAL LOW (ref 8.9–10.3)
Chloride: 102 mmol/L (ref 98–111)
Creatinine, Ser: 0.81 mg/dL (ref 0.44–1.00)
GFR, Estimated: >60 mL/min (ref >60)
Glucose, Bld: 98 mg/dL (ref 70–99)
Potassium: 3.6 mmol/L (ref 3.5–5.1)
Sodium: 132 mmol/L — ABNORMAL LOW (ref 135–145)

## 2020-10-18 LAB — RESP PANEL BY RT-PCR (FLU A&B, COVID) ARPGX2
Influenza A by PCR: NEGATIVE
Influenza B by PCR: NEGATIVE
SARS Coronavirus 2 by RT PCR: POSITIVE — AB

## 2020-10-18 LAB — TROPONIN I (HIGH SENSITIVITY)
Troponin I (High Sensitivity): <2 ng/L (ref <18)
Troponin I (High Sensitivity): <2 ng/L (ref <18)

## 2020-10-18 LAB — D-DIMER, QUANTITATIVE: D-Dimer, Quant: 0.39 ug/mL-FEU (ref 0.00–0.50)

## 2020-10-18 MED ORDER — KETOROLAC TROMETHAMINE 60 MG/2ML IM SOLN
60.0000 mg | Freq: Once | INTRAMUSCULAR | Status: AC
  Administered 2020-10-18: 60 mg via INTRAMUSCULAR
  Filled 2020-10-18: qty 2

## 2020-10-18 MED ORDER — OMEPRAZOLE 20 MG PO CPDR: 20.0000 mg | DELAYED_RELEASE_CAPSULE | Freq: Every day | ORAL | 0 refills | Status: AC

## 2020-10-18 MED ORDER — ALUM & MAG HYDROXIDE-SIMETH 200-200-20 MG/5ML PO SUSP
30.0000 mL | Freq: Once | ORAL | Status: AC
  Administered 2020-10-18: 30 mL via ORAL
  Filled 2020-10-18: qty 30

## 2020-10-18 MED ORDER — IBUPROFEN 800 MG PO TABS: 800.0000 mg | ORAL_TABLET | Freq: Three times a day (TID) | ORAL | 0 refills | Status: AC

## 2020-10-18 MED ORDER — LIDOCAINE VISCOUS HCL 2 % MT SOLN
15.0000 mL | Freq: Once | OROMUCOSAL | Status: AC
Start: 2020-10-18 — End: 2020-10-18
  Administered 2020-10-18: 15 mL via ORAL
  Filled 2020-10-18: qty 15

## 2020-10-18 NOTE — Discharge Instructions (Addendum)
Your work-up today was overall reassuring.  Your white blood cell count was a little bit low today, please follow-up with your primary care doctor to have this rechecked.  If you do not have a primary care doctor, please call the phone number in your discharge paperwork to establish with one.  Please take ibuprofen, you may also try Prilosec which I have prescribed for your symptoms.  Return to the ER for any new or worsening symptoms.

## 2020-10-18 NOTE — ED Notes (Signed)
Pt approached the nurses station stating "I want my discharge papers now. I've been here 8 hours and y'all haven't even been able to tell me why my chest hurts. I want my papers or I'm walking out." PA made aware.

## 2020-10-18 NOTE — ED Notes (Signed)
Date and time results received: 10/18/20 1928  Test: Covid 19 swab Critical Value: Positive  Name of Provider Notified: Benjiman Core MD  Orders Received? Or Actions Taken?: None

## 2020-10-18 NOTE — ED Triage Notes (Signed)
Pt states she has chest pain that radiates to her back.  Pain woke her from sleep last night and radiates to her back.

## 2020-10-18 NOTE — ED Provider Notes (Signed)
[facility] EMERGENCY DEPARTMENT Provider Note   CSN: [id number] Arrival date & time: 10/18/20  1325     History Chief Complaint  Patient presents with  . Chest Pain    [name] is a 40 y.o. female.  HPI 40 year old female with history of asthma, bronchitis resents to the ER with complaints of chest pain that radiates to her back.  Patient states that this woke her up late last night, and has continued into today.  She describes the pain as squeezing.  Denies any abdominal pain.  No nausea or vomiting.  Worsened with movement.  No prior cardiac history.  No shortness of breath, some mild pain on inspiration.  Has not done any heavy lifting or strenuous physical activity.  States she had some pizza last night.  Has not taken anything for her symptoms.  Denies any fevers or chills.  No cough.    Past Medical History:  Diagnosis Date  . Asthma   . Bronchitis   . Family history of punctured lung     There are no problems to display for this patient.   Past Surgical History:  Procedure Laterality Date  . HAND SURGERY    . SHOULDER SURGERY  2009     OB History   No obstetric history on file.     Family History  Problem Relation Age of Onset  . Healthy Mother   . Healthy Father     Social History   Tobacco Use  . Smoking status: Current Every Day Smoker    Packs/day: 1.00    Types: Cigarettes  . Smokeless tobacco: Never Used  Substance Use Topics  . Alcohol use: Never    Home Medications Prior to Admission medications   Medication Sig Start Date End Date Taking? Authorizing Provider  ibuprofen (ADVIL) 800 MG tablet Take 1 tablet (800 mg total) by mouth 3 (three) times daily. 10/18/20  Yes Garald Balding, PA-C  omeprazole (PRILOSEC) 20 MG capsule Take 1 capsule (20 mg total) by mouth daily. 10/18/20  Yes Garald Balding, PA-C    Allergies    Penicillins  Review of Systems   Review of Systems  Constitutional: Negative for chills and fever.  HENT:  Negative for ear pain and sore throat.   Eyes: Negative for pain and visual disturbance.  Respiratory: Negative for cough and shortness of breath.   Cardiovascular: Positive for chest pain. Negative for palpitations.  Gastrointestinal: Negative for abdominal pain and vomiting.  Genitourinary: Negative for dysuria and hematuria.  Musculoskeletal: Negative for arthralgias and back pain.  Skin: Negative for color change and rash.  Neurological: Negative for seizures and syncope.  All other systems reviewed and are negative.   Physical Exam Updated Vital Signs BP 121/65   Pulse 74   Temp 98.7 F (37.1 C) (Oral)   Resp 18   Ht 5\' 7"  (1.702 m)   Wt 49.9 kg   LMP 10/12/2020 (Approximate)   SpO2 98%   BMI 17.23 kg/m   Physical Exam Vitals and nursing note reviewed.  Constitutional:      General: She is not in acute distress.    Appearance: She is well-developed and well-nourished. She is not ill-appearing, toxic-appearing or diaphoretic.  HENT:     Head: Normocephalic and atraumatic.  Eyes:     Conjunctiva/sclera: Conjunctivae normal.  Cardiovascular:     Rate and Rhythm: Normal rate and regular rhythm.     Heart sounds: Normal heart sounds. No murmur heard.  Pulmonary:     Effort: Pulmonary effort is normal. No respiratory distress.     Breath sounds: Normal breath sounds. No decreased breath sounds, wheezing, rhonchi or rales.  Chest:     Chest wall: No tenderness.       Comments: Reproducible chest wall tenderness to the sternum and lateral chest wall bilaterally.  Some mild paraspinal muscle tenderness to the back. Abdominal:     Palpations: Abdomen is soft.     Tenderness: There is no abdominal tenderness.     Comments: Negative Murphy's  Musculoskeletal:        General: No edema. Normal range of motion.     Cervical back: Neck supple.     Right lower leg: No tenderness.     Left lower leg: No tenderness.  Skin:    General: Skin is warm and dry.  Neurological:      General: No focal deficit present.     Mental Status: She is alert.  Psychiatric:        Mood and Affect: Mood and affect normal.        Behavior: Behavior normal.     ED Results / Procedures / Treatments   Labs (all labs ordered are listed, but only abnormal results are displayed) Labs Reviewed  BASIC METABOLIC PANEL - Abnormal; Notable for the following components:      Result Value   Sodium 132 (*)    Calcium 8.5 (*)    All other components within normal limits  CBC - Abnormal; Notable for the following components:   WBC 2.6 (*)    RBC 3.82 (*)    Hemoglobin 11.7 (*)    RDW 15.7 (*)    All other components within normal limits  RESP PANEL BY RT-PCR (FLU A&B, COVID) ARPGX2  D-DIMER, QUANTITATIVE (NOT AT [facility])  POC URINE PREG, ED  TROPONIN I (HIGH SENSITIVITY)  TROPONIN I (HIGH SENSITIVITY)    EKG EKG Interpretation  Date/Time:  Sunday October 18 2020 13:36:52 EST Ventricular Rate:  75 PR Interval:  138 QRS Duration: 80 QT Interval:  378 QTC Calculation: 422 R Axis:   84 Text Interpretation: Normal sinus rhythm Normal ECG Confirmed by Pickering, Nathan ([id number]) on 10/18/2020 5:55:47 PM   Radiology DG Chest 2 View  Result Date: 10/18/2020 CLINICAL DATA:  Chest pain EXAM: CHEST - 2 VIEW COMPARISON:  September 05, 2008 FINDINGS: The heart size and mediastinal contours are within normal limits. Both lungs are clear. Chronic deformity of the right lateral ninth rib is identified. IMPRESSION: No active cardiopulmonary disease. Electronically Signed   By: Wei-Chen  Lin M.D.   On: 10/18/2020 14:09    Procedures Procedures (including critical care time)  Medications Ordered in ED Medications  ketorolac (TORADOL) injection 60 mg (60 mg Intramuscular Given 10/18/20 1710)  alum & mag hydroxide-simeth (MAALOX/MYLANTA) 200-200-20 MG/5ML suspension 30 mL (30 mLs Oral Given 10/18/20 1708)    And  lidocaine (XYLOCAINE) 2 % viscous mouth solution 15 mL (15 mLs Oral Given 10/18/20  1709)    ED Course  I have reviewed the triage vital signs and the nursing notes.  Pertinent labs & imaging results that were available during my care of the patient were reviewed by me and considered in my medical decision making (see chart for details).    MDM Rules/Calculators/A&P                         39  year old female with complaints of chest  pain.  On arrival, she is alert, oriented, nontoxic-appearing, no acute distress, resting comfortably in the ER bed.  Vitals overall reassuring.  No evidence of hypoxia, tachycardia, afebrile.  Physical exam with reproducible chest wall tenderness.  CBC with leukopenia of 2.6, unclear etiology.  BMP with mild hyponatremia, no electrode abnormalities.  D-dimer is negative.  Initial troponin less than 2.  Chest x-ray without evidence of pneumonia or infection.  EKG normal sinus rhythm.  Patient treated with Toradol, GI cocktail, states pain decreased down to 4.  We will repeat second troponin, if this is normal, patient will be stable for discharge.  Will send ibuprofen and omeprazole, question musculoskeletal pain versus GERD.  Suspicion for dissection at this time.   Signed out to [facility] who will oversee the patient's troponin and discharge accordingly.  Final Clinical Impression(s) / ED Diagnoses Final diagnoses:  Chest pain, unspecified type    Rx / DC Orders ED Discharge Orders         Ordered    ibuprofen (ADVIL) 800 MG tablet  3 times daily        10/18/20 1853    omeprazole (PRILOSEC) 20 MG capsule  Daily        10/18/20 1853           Leone Brand 10/18/20 1859    Benjiman Core, MD 10/19/20 0004

## 2020-10-18 NOTE — ED Notes (Signed)
POC urine preg was negative.  

## 2020-10-19 LAB — POC URINE PREG, ED: Preg Test, Ur: NEGATIVE

## 2020-10-20 ENCOUNTER — Telehealth: Payer: Self-pay

## 2020-10-20 ENCOUNTER — Other Ambulatory Visit: Payer: Self-pay

## 2020-10-20 ENCOUNTER — Encounter (HOSPITAL_COMMUNITY): Payer: Self-pay | Admitting: Emergency Medicine

## 2020-10-20 ENCOUNTER — Emergency Department (HOSPITAL_COMMUNITY)
Admission: EM | Admit: 2020-10-20 | Discharge: 2020-10-20 | Disposition: A | Discharge status: Home / Self Care | Payer: Medicaid Other | Attending: Emergency Medicine | Admitting: Emergency Medicine

## 2020-10-20 DIAGNOSIS — R079 Chest pain, unspecified: Secondary | ICD-10-CM | POA: Diagnosis present

## 2020-10-20 DIAGNOSIS — U071 COVID-19: Secondary | ICD-10-CM | POA: Diagnosis not present

## 2020-10-20 NOTE — ED Triage Notes (Signed)
Seen yesterday at [facility] with chest pain, dx with covid -- c/o flank pain, and body aches, chest pain--- has not taken any OTC meds--  UNVACCINATED

## 2020-10-20 NOTE — Telephone Encounter (Signed)
Transition of Care Contact from Inpatient Facility  Date of Discharge: 10/17/20 Date of Contact: 10/20/20 Method of contact: phone - attempted  Attempted to contact patient to discuss transition of care from inpatient admission.  Patient did not answer the phone.  Message was left on patient's voicemail informing them we would attempt to call them a

## 2020-10-20 NOTE — ED Notes (Signed)
Pt name called 3 times with no answer. 

## 2021-03-01 ENCOUNTER — Other Ambulatory Visit: Payer: Self-pay

## 2021-03-01 ENCOUNTER — Ambulatory Visit (INDEPENDENT_AMBULATORY_CARE_PROVIDER_SITE_OTHER): Payer: Medicaid Other

## 2021-03-01 ENCOUNTER — Ambulatory Visit
Admission: EM | Admit: 2021-03-01 | Discharge: 2021-03-01 | Disposition: A | Discharge status: Home / Self Care | Payer: Medicaid Other | Attending: Emergency Medicine | Admitting: Emergency Medicine

## 2021-03-01 DIAGNOSIS — W19XXXA Unspecified fall, initial encounter: Secondary | ICD-10-CM

## 2021-03-01 DIAGNOSIS — M25561 Pain in right knee: Secondary | ICD-10-CM

## 2021-03-01 DIAGNOSIS — S8001XA Contusion of right knee, initial encounter: Secondary | ICD-10-CM | POA: Diagnosis not present

## 2021-03-01 NOTE — ED Triage Notes (Signed)
Pt presents with c/o right knee pain from injury getting out of truck on Saturday

## 2021-03-01 NOTE — ED Provider Notes (Signed)
[facility]    CSN: [id number] Arrival date & time: 03/01/21  0851      History   Chief Complaint Chief Complaint  Patient presents with  . Knee Injury    HPI [name] is a 40 y.o. female.   [name] presents with complaints of medial right knee pain after injury two days ago. While getting out of her truck it struck the vehicle. Pain since. Pain with weight bearing. Bruising present. Sharp pain. No previous injury to the knee. No calf pain or swelling. Took ibuprofen which didn't help. Had to leave work due to pain.      Past Medical History:  Diagnosis Date  . Asthma   . Bronchitis   . Family history of punctured lung     There are no problems to display for this patient.   Past Surgical History:  Procedure Laterality Date  . HAND SURGERY    . SHOULDER SURGERY  2009    OB History   No obstetric history on file.      Home Medications    Prior to Admission medications   Medication Sig Start Date End Date Taking? Authorizing Provider  ibuprofen (ADVIL) 800 MG tablet Take 1 tablet (800 mg total) by mouth 3 (three) times daily. 10/18/20   Mare Ferrari, PA-C  omeprazole (PRILOSEC) 20 MG capsule Take 1 capsule (20 mg total) by mouth daily. 10/18/20   Mare Ferrari, PA-C    Family History Family History  Problem Relation Age of Onset  . Healthy Mother   . Healthy Father     Social History Social History   Tobacco Use  . Smoking status: Current Every Day Smoker    Packs/day: 1.00    Types: Cigarettes  . Smokeless tobacco: Never Used  Substance Use Topics  . Alcohol use: Never     Allergies   Penicillins   Review of Systems Review of Systems   Physical Exam Triage Vital Signs ED Triage Vitals  Enc Vitals Group     BP 03/01/21 0928 99/70     Pulse Rate 03/01/21 0928 71     Resp 03/01/21 0928 18     Temp 03/01/21 0928 98.9 F (37.2 C)     Temp src --      SpO2 03/01/21 0928 97 %     Weight --       Height --      Head Circumference --      Peak Flow --      Pain Score 03/01/21 0927 6     Pain Loc --      Pain Edu? --      Excl. in [place]? --    No data found.  Updated Vital Signs BP 99/70   Pulse 71   Temp 98.9 F (37.2 C)   Resp 18   SpO2 97%   Visual Acuity Right Eye Distance:   Left Eye Distance:   Bilateral Distance:    Right Eye Near:   Left Eye Near:    Bilateral Near:     Physical Exam Constitutional:      General: She is not in acute distress.    Appearance: She is well-developed.  Cardiovascular:     Rate and Rhythm: Normal rate.  Pulmonary:     Effort: Pulmonary effort is normal.  Musculoskeletal:     Right knee: Erythema and bony tenderness present. Normal range of motion. Tenderness present over the medial joint  line.       Legs:  Skin:    General: Skin is warm and dry.  Neurological:     Mental Status: She is alert and oriented to person, place, and time.      UC Treatments / Results  Labs (all labs ordered are listed, but only abnormal results are displayed) Labs Reviewed - No data to display  EKG   Radiology DG Knee Complete 4 Views Right  Result Date: 03/01/2021 CLINICAL DATA:  Medial right knee pain after fall 2 days ago EXAM: RIGHT KNEE - COMPLETE 4+ VIEW COMPARISON:  None. FINDINGS: No evidence of fracture, dislocation, or joint effusion. No evidence of arthropathy or other focal bone abnormality. Soft tissues are unremarkable. IMPRESSION: Negative. Electronically Signed   By: Duanne Guess D.O.   On: 03/01/2021 10:00    Procedures Procedures (including critical care time)  Medications Ordered in UC Medications - No data to display  Initial Impression / Assessment and Plan / UC Course  I have reviewed the triage vital signs and the nursing notes.  Pertinent labs & imaging results that were available during my care of the patient were reviewed by me and considered in my medical decision making (see chart for details).      Xray without acute findings, consistent with contusion. Pain management and expected course of rehab discussed. Patient verbalized understanding and agreeable to plan.  Ambulatory out of clinic without difficulty.    Final Clinical Impressions(s) / UC Diagnoses   Final diagnoses:  Contusion of right knee, initial encounter     Discharge Instructions     Ice, elevation, ibuprofen regularly, to help with pain. Activity as tolerated.  I would expect improvement in the next week.  Return or follow up with orthopedics for any persistent pain.     ED Prescriptions    None     PDMP not reviewed this encounter.   Georgetta Haber, NP 03/01/21 1535

## 2021-03-01 NOTE — ED Triage Notes (Signed)
Needs work note for today

## 2021-03-01 NOTE — Discharge Instructions (Signed)
Ice, elevation, ibuprofen regularly, to help with pain. Activity as tolerated.  I would expect improvement in the next week.  Return or follow up with orthopedics for any persistent pain.

## 2021-05-03 ENCOUNTER — Ambulatory Visit
Admission: EM | Admit: 2021-05-03 | Discharge: 2021-05-03 | Disposition: A | Discharge status: Home / Self Care | Payer: Medicaid Other | Attending: Family Medicine | Admitting: Family Medicine

## 2021-05-03 ENCOUNTER — Other Ambulatory Visit: Payer: Self-pay

## 2021-05-03 ENCOUNTER — Encounter: Payer: Self-pay | Admitting: Emergency Medicine

## 2021-05-03 DIAGNOSIS — J209 Acute bronchitis, unspecified: Secondary | ICD-10-CM

## 2021-05-03 MED ORDER — PREDNISONE 20 MG PO TABS: 20.0000 mg | ORAL_TABLET | Freq: Every day | ORAL | 0 refills | Status: AC

## 2021-05-03 MED ORDER — DOXYCYCLINE HYCLATE 100 MG PO CAPS: 100.0000 mg | ORAL_CAPSULE | Freq: Two times a day (BID) | ORAL | 0 refills | Status: DC

## 2021-05-03 MED ORDER — ALBUTEROL SULFATE HFA 108 (90 BASE) MCG/ACT IN AERS
2.0000 | INHALATION_SPRAY | Freq: Once | RESPIRATORY_TRACT | Status: AC
  Administered 2021-05-03: 2 via RESPIRATORY_TRACT

## 2021-05-03 NOTE — ED Triage Notes (Signed)
Having some wheezing and tightness in chest that started today.  Pt is out of albuterol inhaler.

## 2021-08-17 DIAGNOSIS — J019 Acute sinusitis, unspecified: Secondary | ICD-10-CM | POA: Diagnosis not present

## 2021-08-17 DIAGNOSIS — J209 Acute bronchitis, unspecified: Secondary | ICD-10-CM | POA: Diagnosis not present

## 2021-08-17 DIAGNOSIS — J069 Acute upper respiratory infection, unspecified: Secondary | ICD-10-CM | POA: Diagnosis not present

## 2021-12-01 DIAGNOSIS — J029 Acute pharyngitis, unspecified: Secondary | ICD-10-CM | POA: Diagnosis not present

## 2021-12-01 DIAGNOSIS — J111 Influenza due to unidentified influenza virus with other respiratory manifestations: Secondary | ICD-10-CM | POA: Diagnosis not present

## 2021-12-01 DIAGNOSIS — J019 Acute sinusitis, unspecified: Secondary | ICD-10-CM | POA: Diagnosis not present

## 2021-12-01 DIAGNOSIS — J209 Acute bronchitis, unspecified: Secondary | ICD-10-CM | POA: Diagnosis not present

## 2022-07-13 ENCOUNTER — Ambulatory Visit: Payer: Medicaid Other | Admitting: Family Medicine

## 2022-07-14 ENCOUNTER — Ambulatory Visit: Payer: Medicaid Other | Admitting: Nurse Practitioner

## 2022-09-27 ENCOUNTER — Ambulatory Visit
Admission: EM | Admit: 2022-09-27 | Discharge: 2022-09-27 | Disposition: A | Discharge status: Home / Self Care | Payer: Medicaid Other | Attending: Nurse Practitioner | Admitting: Nurse Practitioner

## 2022-09-27 ENCOUNTER — Encounter: Payer: Self-pay | Admitting: Emergency Medicine

## 2022-09-27 ENCOUNTER — Other Ambulatory Visit: Payer: Self-pay

## 2022-09-27 DIAGNOSIS — K0889 Other specified disorders of teeth and supporting structures: Secondary | ICD-10-CM

## 2022-09-27 MED ORDER — AMOXICILLIN-POT CLAVULANATE 875-125 MG PO TABS: 1.0000 | ORAL_TABLET | Freq: Two times a day (BID) | ORAL | 0 refills | Status: AC

## 2022-09-27 NOTE — Discharge Instructions (Signed)
I am concerned he may have a dental infection because of the pain you are having.  Please start on the Augmentin and take this twice daily for 7 days.  Continue supportive care with oral gel, ibuprofen, or Tylenol as needed for pain.  Follow-up with a dentist in the next week or so.

## 2022-09-27 NOTE — ED Provider Notes (Signed)
[facility]    CSN: [id number] Arrival date & time: 09/27/22  1119      History   Chief Complaint Chief Complaint  Patient presents with   Dental Pain    HPI [name] is a 41 y.o. female.   Patient presents today with pain in her upper and lower gums since Saturday.  Reports she has multiple broken teeth and thinks they are infected.  She does have a dentist, however has not been able to get in with them this week.  Reports the pain is severe.  She has been taking BC Goody, ibuprofen, Tylenol, and trying oral gel without much benefit.  No swelling in her face or inside her mouth.  No active drainage.  No fevers or nausea/vomiting.  Patient denies antibiotic use in the past 90 days.  Reports when she was a child and receiving her childhood vaccines, she was told to not take penicillin.  She reports she has taken amoxicillin since then and has tolerated well.  She also reports she tolerates Keflex.    Past Medical History:  Diagnosis Date   Asthma    Bronchitis    Family history of punctured lung     There are no problems to display for this patient.   Past Surgical History:  Procedure Laterality Date   HAND SURGERY     SHOULDER SURGERY  2009   TUBAL LIGATION      OB History   No obstetric history on file.      Home Medications    Prior to Admission medications   Medication Sig Start Date End Date Taking? Authorizing Provider  amoxicillin-clavulanate (AUGMENTIN) 875-125 MG tablet Take 1 tablet by mouth 2 (two) times daily for 7 days. 09/27/22 10/04/22 Yes Valentino Nose, NP  ibuprofen (ADVIL) 800 MG tablet Take 1 tablet (800 mg total) by mouth 3 (three) times daily. Patient taking differently: Take 800 mg by mouth as needed. 10/18/20   Mare Ferrari, PA-C  omeprazole (PRILOSEC) 20 MG capsule Take 1 capsule (20 mg total) by mouth daily. 10/18/20   Mare Ferrari, PA-C    Family History Family History  Problem Relation Age of Onset    Healthy Mother    Healthy Father     Social History Social History   Tobacco Use   Smoking status: Every Day    Packs/day: 1.00    Types: Cigarettes   Smokeless tobacco: Never  Substance Use Topics   Alcohol use: Never     Allergies   Penicillins   Review of Systems Review of Systems Per HPI  Physical Exam Triage Vital Signs ED Triage Vitals  Enc Vitals Group     BP 09/27/22 1321 109/66     Pulse Rate 09/27/22 1321 64     Resp 09/27/22 1321 20     Temp 09/27/22 1321 98.2 F (36.8 C)     Temp Source 09/27/22 1321 Oral     SpO2 09/27/22 1321 97 %     Weight --      Height --      Head Circumference --      Peak Flow --      Pain Score 09/27/22 1318 5     Pain Loc --      Pain Edu? --      Excl. in GC? --    No data found.  Updated Vital Signs BP 109/66 (BP Location: Right Arm)   Pulse 64  Temp 98.2 F (36.8 C) (Oral)   Resp 20   LMP 09/18/2022 (Approximate)   SpO2 97%   Visual Acuity Right Eye Distance:   Left Eye Distance:   Bilateral Distance:    Right Eye Near:   Left Eye Near:    Bilateral Near:     Physical Exam Vitals and nursing note reviewed.  Constitutional:      General: She is not in acute distress.    Appearance: Normal appearance. She is not toxic-appearing.  HENT:     Head: Normocephalic and atraumatic.     Mouth/Throat:     Mouth: Mucous membranes are moist.     Dentition: Abnormal dentition. Gingival swelling and dental caries present. No dental abscesses.     Pharynx: Oropharynx is clear. No posterior oropharyngeal erythema.      Comments: Dental pain and broken teeth noted to areas marked; there is also pain with light touch to gingival areas Eyes:     General: No scleral icterus.    Extraocular Movements: Extraocular movements intact.  Pulmonary:     Effort: Pulmonary effort is normal. No respiratory distress.  Musculoskeletal:     Cervical back: Normal range of motion.  Lymphadenopathy:     Cervical: No cervical  adenopathy.  Skin:    General: Skin is warm and dry.     Coloration: Skin is not jaundiced or pale.     Findings: No erythema.  Neurological:     Mental Status: She is alert and oriented to person, place, and time.  Psychiatric:        Behavior: Behavior is cooperative.      UC Treatments / Results  Labs (all labs ordered are listed, but only abnormal results are displayed) Labs Reviewed - No data to display  EKG   Radiology No results found.  Procedures Procedures (including critical care time)  Medications Ordered in UC Medications - No data to display  Initial Impression / Assessment and Plan / UC Course  I have reviewed the triage vital signs and the nursing notes.  Pertinent labs & imaging results that were available during my care of the patient were reviewed by me and considered in my medical decision making (see chart for details).   Patient is well-appearing, normotensive, afebrile, not tachycardic, not tachypneic, oxygenating well on room air.    Dentalgia Suspect gingivitis versus early dental abscess Treat with Augmentin twice daily for 7 days Patient has penicillin marked as allergy, however reports she tolerates amoxicillin Continue ibuprofen/Tylenol as needed for pain Note given for work Recommended close follow-up with dentist-she reports she can get an appointment next week  The patient was given the opportunity to ask questions.  All questions answered to their satisfaction.  The patient is in agreement to this plan.    Final Clinical Impressions(s) / UC Diagnoses   Final diagnoses:  Dentalgia     Discharge Instructions      I am concerned he may have a dental infection because of the pain you are having.  Please start on the Augmentin and take this twice daily for 7 days.  Continue supportive care with oral gel, ibuprofen, or Tylenol as needed for pain.  Follow-up with a dentist in the next week or so.    ED Prescriptions     Medication  Sig Dispense Auth. Provider   amoxicillin-clavulanate (AUGMENTIN) 875-125 MG tablet Take 1 tablet by mouth 2 (two) times daily for 7 days. 14 tablet Valentino Nose, NP  PDMP not reviewed this encounter.   Valentino Nose, NP 09/27/22 1330

## 2022-09-27 NOTE — ED Triage Notes (Signed)
Pt reports dental pain since Saturday. Pt reports pain started after brushing teeth. Pt reports unable to get in with dentist at this time and reports pain is getting worse with each day.

## 2022-10-03 DIAGNOSIS — J069 Acute upper respiratory infection, unspecified: Secondary | ICD-10-CM | POA: Diagnosis not present

## 2022-10-03 DIAGNOSIS — J019 Acute sinusitis, unspecified: Secondary | ICD-10-CM | POA: Diagnosis not present

## 2022-10-03 DIAGNOSIS — R091 Pleurisy: Secondary | ICD-10-CM | POA: Diagnosis not present

## 2023-08-15 ENCOUNTER — Telehealth: Payer: Self-pay

## 2023-08-15 NOTE — Telephone Encounter (Signed)
  Medicaid Managed Care   Unsuccessful Outreach Note  08/15/2023 Name: [name] MRN: [id number] DOB: 1980-11-09  Referred by: Pcp, No Reason for referral : No chief complaint on file.   An unsuccessful telephone outreach was attempted today. The patient was referred to the case management team for assistance with care management and care coordination.   Follow Up Plan: If patient returns call to provider office, please advise to call Embedded Care Management Care Guide Nicholes Rough* at [phone]*  Nicholes Rough, CMA Care Guide [employer]
